# Patient Record
Sex: Male | Born: 1985 | Race: White | Hispanic: No | Marital: Single | Smoking: Never smoker
Health system: Southern US, Community
[De-identification: ages and names within clinical notes are randomized; demographics above are authoritative.]

## PROBLEM LIST (undated history)

## (undated) DIAGNOSIS — K219 Gastro-esophageal reflux disease without esophagitis: Secondary | ICD-10-CM

## (undated) DIAGNOSIS — F909 Attention-deficit hyperactivity disorder, unspecified type: Secondary | ICD-10-CM

## (undated) DIAGNOSIS — K8020 Calculus of gallbladder without cholecystitis without obstruction: Secondary | ICD-10-CM

## (undated) HISTORY — PX: FRACTURE SURGERY: SHX138

---
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
## 2004-12-09 ENCOUNTER — Ambulatory Visit: Payer: Self-pay | Admitting: Family Medicine

## 2004-12-10 ENCOUNTER — Encounter: Payer: Self-pay | Admitting: Family Medicine

## 2004-12-10 LAB — CONVERTED CEMR LAB: TSH: 2.36 microintl units/mL

## 2004-12-27 ENCOUNTER — Ambulatory Visit: Payer: Self-pay | Admitting: Cardiovascular Disease

## 2005-01-06 ENCOUNTER — Ambulatory Visit: Payer: Self-pay

## 2005-02-01 ENCOUNTER — Ambulatory Visit (HOSPITAL_COMMUNITY): Admission: RE | Admit: 2005-02-01 | Discharge: 2005-02-01 | Payer: Self-pay | Admitting: Emergency Medicine

## 2005-02-19 ENCOUNTER — Ambulatory Visit: Payer: Self-pay | Admitting: Cardiovascular Disease

## 2005-09-01 ENCOUNTER — Ambulatory Visit: Payer: Self-pay | Admitting: Family Medicine

## 2005-09-15 ENCOUNTER — Ambulatory Visit: Payer: Self-pay | Admitting: Family Medicine

## 2006-02-16 ENCOUNTER — Ambulatory Visit: Payer: Self-pay | Admitting: Family Medicine

## 2006-05-21 IMAGING — CT CT HEAD W/O CM
1 series · 16 of 30 positions shown, 20 images · non-contrast
Comparison: none

CLINICAL DATA: 19 year old male; headache and fever.
 HEAD CT WITHOUT CONTRAST ? 02/01/05:
TECHNIQUE: 5 mm collimated images were obtained from the base of the skull through the vertex according to standard protocol without contrast.
 No comparisons.

[Series 2: head_seq 4.5 h42s st · axial · 0.43mm/px · z∈[-144,-18]mm · 16 of 32 slices shown, 20 images]
[im 2/32  brain]
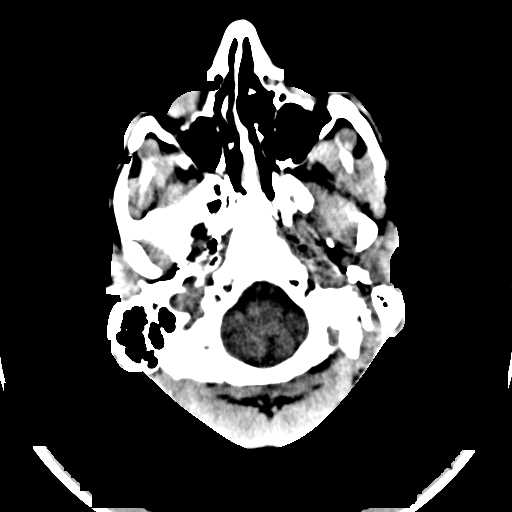
[im 2/32  bone]
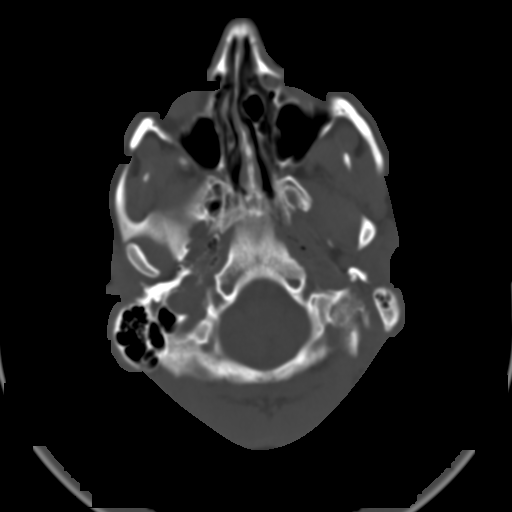
[im 4/32  brain]
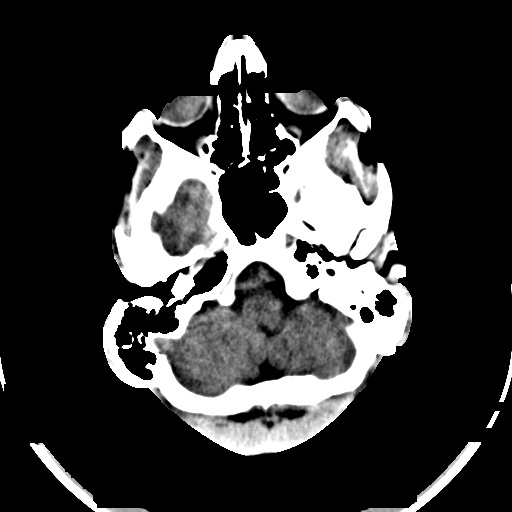
[im 6/32  brain]
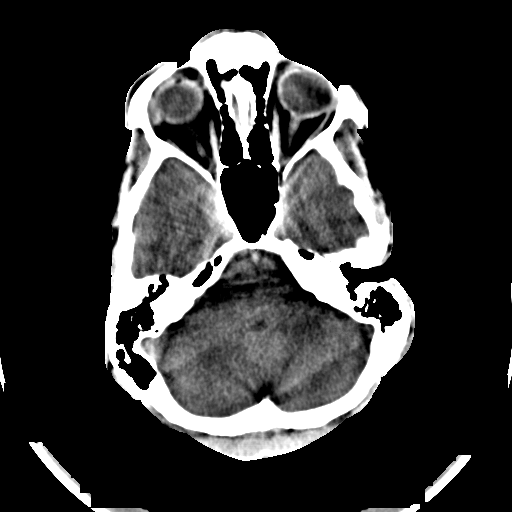
[im 8/32  brain]
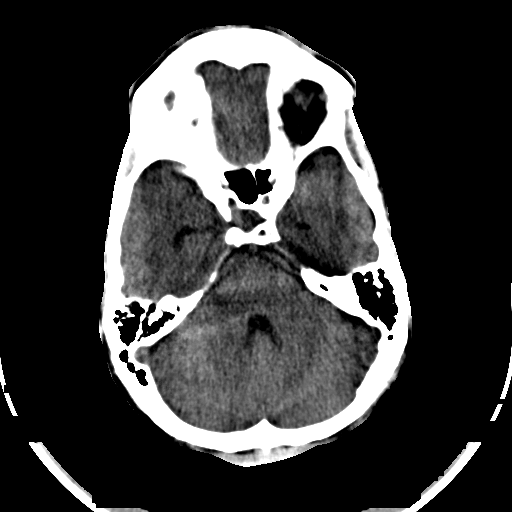
[im 9/32  brain]
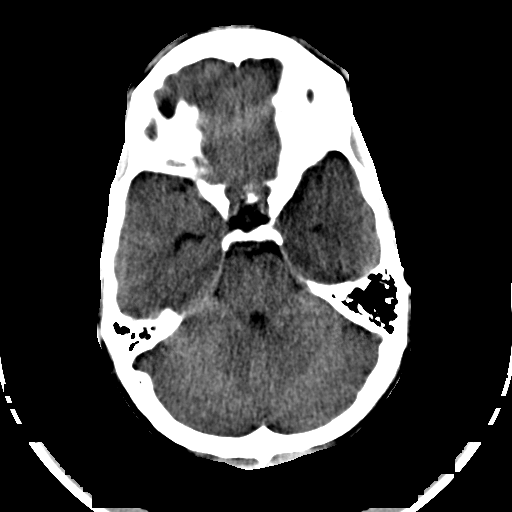
[im 9/32  bone]
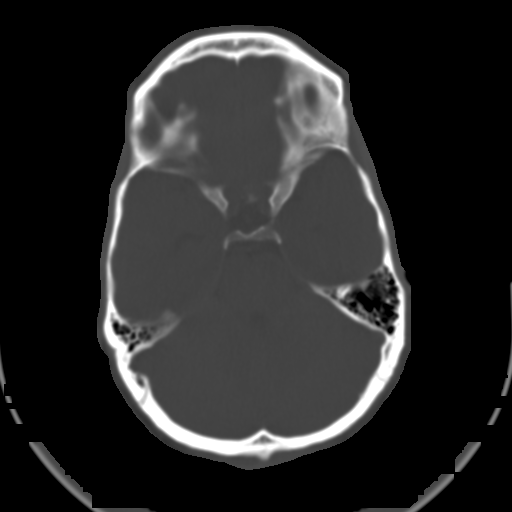
[im 11/32  brain]
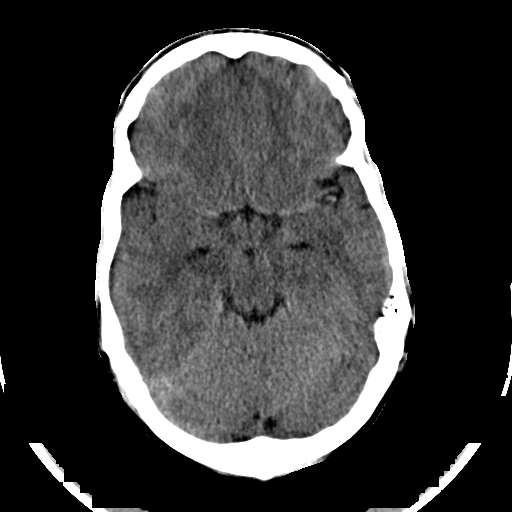
[im 13/32  brain]
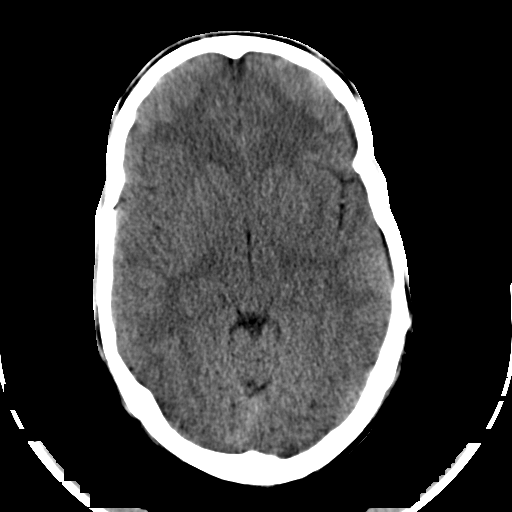
[im 15/32  brain]
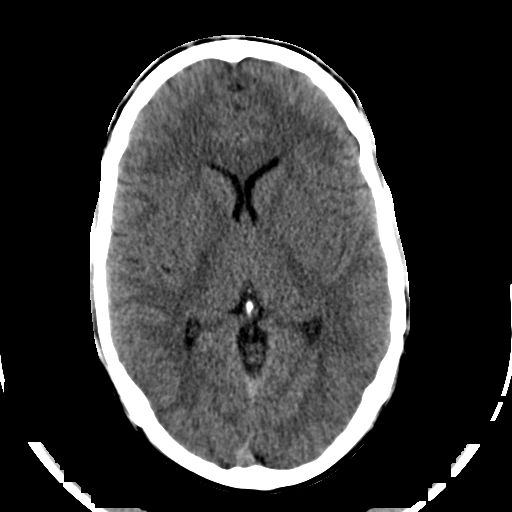
[im 17/32  brain]
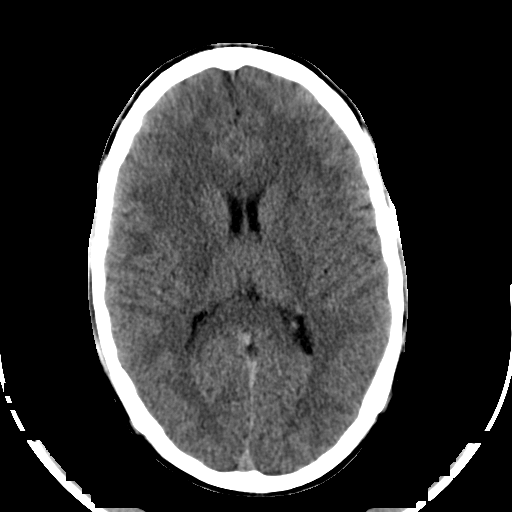
[im 17/32  bone]
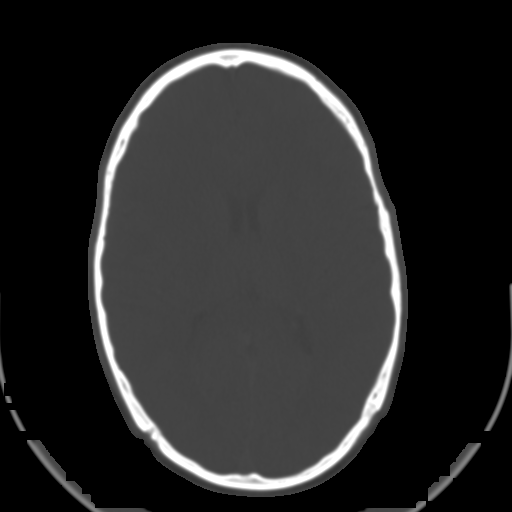
[im 19/32  brain]
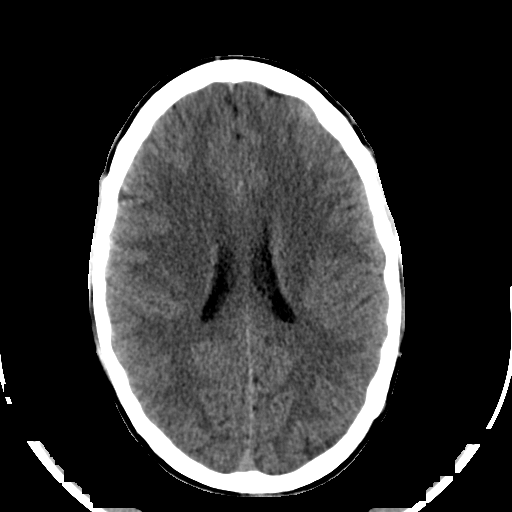
[im 21/32  brain]
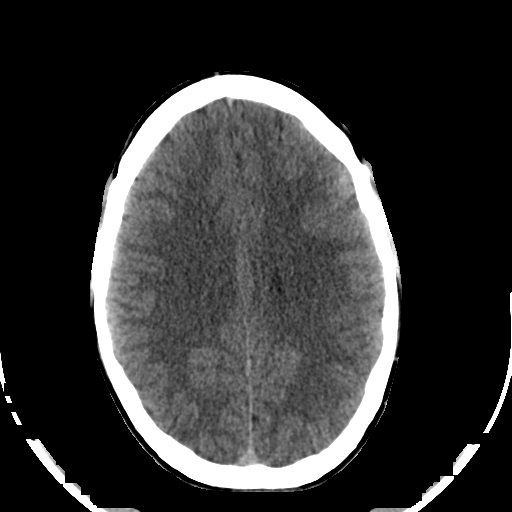
[im 23/32  brain]
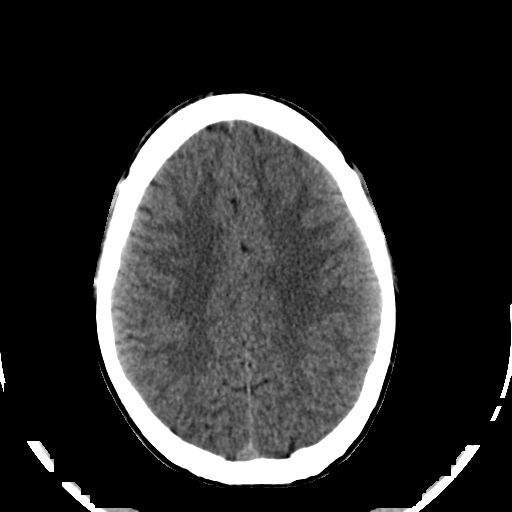
[im 24/32  brain]
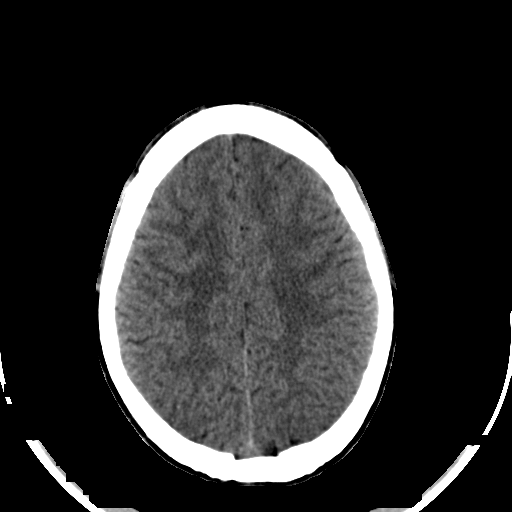
[im 24/32  bone]
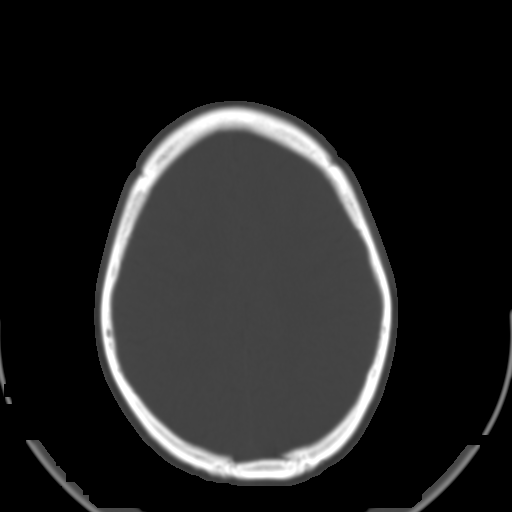
[im 26/32  brain]
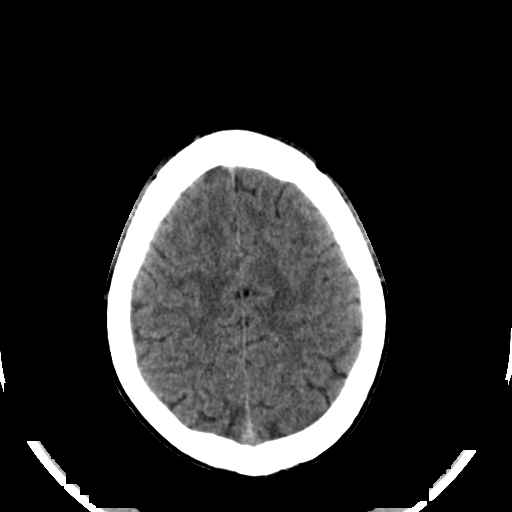
[im 28/32  brain]
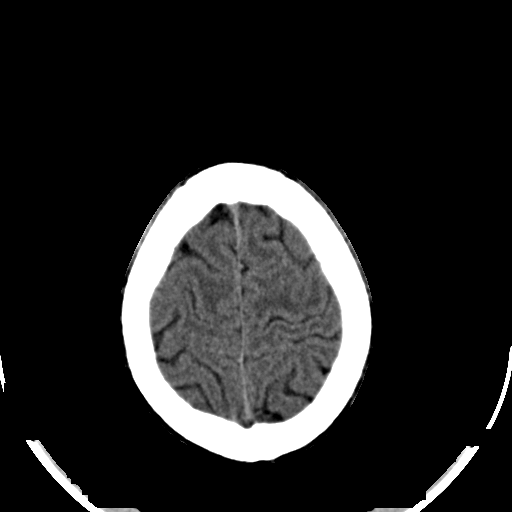
[im 30/32  brain]
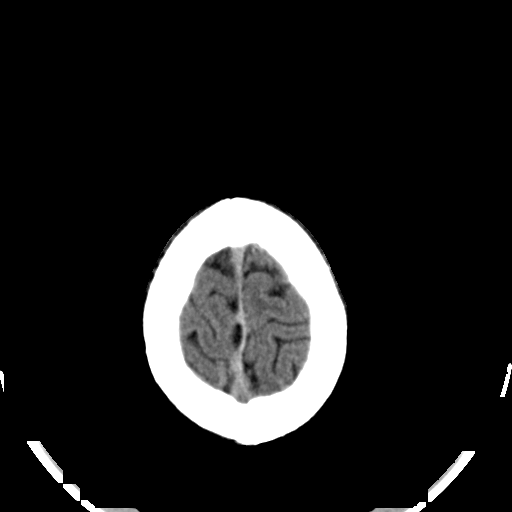

[16 of 30 positions shown; findings below may reference images not displayed]

FINDINGS: No acute mass effect, edema, hemorrhage, herniation, or hydrocephalus.  The gray-white matter differentiation is maintained.  No extra-axial fluid collection.  The ventricles are symmetric and midline.  The cisterns are patent.
 Visualized sinuses and mastoids are clear.
IMPRESSION: No acute intracranial finding.

## 2007-02-18 ENCOUNTER — Emergency Department (HOSPITAL_COMMUNITY): Admission: EM | Admit: 2007-02-18 | Discharge: 2007-02-18 | Payer: Self-pay | Admitting: Emergency Medicine

## 2008-01-03 ENCOUNTER — Ambulatory Visit: Payer: Self-pay | Admitting: Family Medicine

## 2008-01-12 ENCOUNTER — Ambulatory Visit: Payer: Self-pay | Admitting: Family Medicine

## 2008-01-13 ENCOUNTER — Encounter: Payer: Self-pay | Admitting: Family Medicine

## 2009-03-08 ENCOUNTER — Ambulatory Visit: Payer: Self-pay | Admitting: Family Medicine

## 2011-01-10 NOTE — Op Note (Signed)
NAMELARELL, BANEY                       ACCOUNT NO.:  [id number]   MEDICAL RECORD NO.:  [id number]                   PATIENT TYPE:  AMB   LOCATION:  [facility]                                  FACILITY:  [facility]   PHYSICIAN:  Lowell Bouton, M.D.      DATE OF BIRTH:  Aug 01, 1986   DATE OF PROCEDURE:  06/12/2003  DATE OF DISCHARGE:                                 OPERATIVE REPORT   PREOPERATIVE DIAGNOSIS:  Fracture proximal phalanx, left middle finger.   POSTOPERATIVE DIAGNOSIS:  Fracture proximal phalanx, left middle finger.   PROCEDURE:  Open reduction and internal fixation, left middle finger  proximal  phalanx.   SURGEON:  Lowell Bouton, M.D.   ANESTHESIA:  General.   FINDINGS:  The patient had an oblique displaced fracture of the proximal  phalanx of the left middle finger. There was a nondisplaced fracture more  proximal  to the distal  portion.   DESCRIPTION OF PROCEDURE:  Under general anesthesia with a tourniquet on the  left arm the left hand was prepped and draped in the usual sterile fashion.  After exsanguinating the limb the tourniquet was inflated to 250 mmHg. A  0.5% Marcaine digital block was inserted for pain control.   A longitudinal incision was made in the midline across the dorsum of the  proximal phalanx of the left middle finger. Sharp dissection was carried  through the subcutaneous tissues and bleeding points were coagulated. Sharp  dissection was carried down through the extensor tendon to the bone. The  periosteum was elevated with the Therapist, nutritional.  The fracture site was debrided with a curet and irrigated with saline.   The fracture was then reduced and held with an Monaco clamp. A 4-5 K-wire was  placed temporarily across the distal  part of the fracture to stabilize it  and prevent rotation. X-rays showed good alignment. A 2.0-mm screw was then  placed across the fracture site proximal  to the K-wire. This held the  fracture in good alignment. The Ikuda clamp was then removed and a 2nd screw  was placed across the proximal nondisplaced fracture using a 2.0-mm mini  fragment set screw. A 1.5-mm screw was then placed distal  to the first  screw after removing the K-wire to prevent rotation of the fragment. X-rays  showed anatomic alignment. Clinically there was no rotational deformity.   The wound was then irrigated with saline. The periosteum was then closed  with 4-0 Mersilene and the extensor tendon was repaired with 4-0 Mersilene.  The skin was closed with a 3-0 subcuticular Prolene. Steri-Strips were  applied followed  by sterile dressings and a dorsal protective splint.   The patient tolerated the procedure well. He went to the recovery room awake  in stable and good condition.  Lowell Bouton, M.D.    EMM/MEDQ  D:  06/12/2003  T:  06/12/2003  Job:  [id number]

## 2011-06-11 LAB — BASIC METABOLIC PANEL
CO2: 24
Chloride: 101
GFR calc non Af Amer: >60
Potassium: 3.4 — ABNORMAL LOW
Sodium: 138

## 2011-06-11 LAB — CBC
HCT: 45.2
Hemoglobin: 15.9
MCHC: 35.1
MCV: 86.1
RBC: 5.25
RDW: 12.5
WBC: 9.9

## 2011-06-11 LAB — DIFFERENTIAL
Basophils Absolute: 0
Basophils Relative: 0
Eosinophils Relative: 0

## 2013-07-22 ENCOUNTER — Other Ambulatory Visit: Payer: Self-pay | Admitting: Family Medicine

## 2013-07-22 ENCOUNTER — Ambulatory Visit (INDEPENDENT_AMBULATORY_CARE_PROVIDER_SITE_OTHER): Payer: BC Managed Care – PPO | Admitting: Family Medicine

## 2013-07-22 VITALS — BP 118/64 | HR 70 | Temp 99.0°F | Resp 16 | Ht 72.0 in | Wt 164.6 lb

## 2013-07-22 DIAGNOSIS — R002 Palpitations: Secondary | ICD-10-CM

## 2013-07-22 LAB — POCT CBC
Lymph, poc: 1.6 (ref 0.6–3.4)
MCHC: 32.1 g/dL (ref 31.8–35.4)
MCV: 93 fL (ref 80–97)
MID (cbc): 0.3 (ref 0–0.9)
POC LYMPH PERCENT: 32.9 %L (ref 10–50)
Platelet Count, POC: 210 10*3/uL (ref 142–424)
RDW, POC: 13.6 %
WBC: 4.9 10*3/uL (ref 4.6–10.2)

## 2013-07-22 NOTE — Patient Instructions (Signed)
Let me know if you continue to have problems with palpitations.  In that case we will send you to have a "holter monitor."  I will be in touch with your thyroid results.  If you are having chest pain, prolonged palpitations or otherwise are not doing ok please proceed to the ER

## 2013-07-22 NOTE — Progress Notes (Signed)
[facility] [address] [place] [address] [phone]  Date:  07/22/2013   Name:  Victor West   DOB:  31-Oct-1985   MRN:  [id number]  PCP:  Victor Givens, MD    Chief Complaint: Palpitations   History of Present Illness:  Victor West is a 27 y.o. very pleasant male patient who presents with the following:  He has noted heart palpitations for about one week.  He has noted this fairly constantly all week.  It does not seem to matter what he is doing; he will note the palp with activity and at rest.   He has felt a little dizzy.  Tried to exercise yesterday and he felt lightheaded.   He does feel a bit short winded.  "My chest kind of feels tight."  He does not have CP.    He is generally healthy.   He has never had any heart problems as far as he knows.    He may have been staying up later than usual and using more caffiene than usual.  He has tried to cut back on his caffiene over the last couple of day He is not taking any particualar supplements.   He has not been using any drugs. He did drink alcohol last week but not this week.    He does not smoke.  He is generally quite healthy   There are no active problems to display for this patient.   History reviewed. No pertinent past medical history.  History reviewed. No pertinent past surgical history.  History  Substance Use Topics  . Smoking status: Never Smoker   . Smokeless tobacco: Not on file  . Alcohol Use: 1.5 oz/week    3 drink(s) per week    History reviewed. No pertinent family history.  Allergies  Allergen Reactions  . Amoxicillin     REACTION: HIVES, THROAT SWELLING  . Cephalexin     REACTION: GI UPSET  . Erythromycin     REACTION: NAUSEA AND VOMITING    Medication list has been reviewed and updated.  No current outpatient prescriptions on file prior to visit.   No current facility-administered medications on file prior to visit.    Review of  Systems:  As per HPI- otherwise negative.   Physical Examination: Filed Vitals:   07/22/13 1419  BP: 118/64  Pulse: 70  Temp: 99 F (37.2 C)  Resp: 16   Filed Vitals:   07/22/13 1419  Height: 6' (1.829 m)  Weight: 164 lb 9.6 oz (74.662 kg)   Body mass index is 22.32 kg/(m^2). Ideal Body Weight: Weight in (lb) to have BMI = 25: 183.9  GEN: WDWN, NAD, Non-toxic, A & O x 3, looks well HEENT: Atraumatic, Normocephalic. Neck supple. No masses, No LAD. Ears and Nose: No external deformity. CV: RRR, No M/G/R. No JVD. No thrill. No extra heart sounds.  Long listen; no arrythmia noted PULM: CTA B, no wheezes, crackles, rhonchi. No retractions. No resp. distress. No accessory muscle use. ABD: S, NT, ND, +BS. No rebound. No HSM. EXTR: No c/c/e NEURO Normal gait.  PSYCH: Normally interactive. Conversant. Not depressed or anxious appearing.  Calm demeanor.   EKG:  NSR, no arrythmia noted.  Ran rhythm strip for a while and did not have any arrythmia. Had pt change position while on EKG- NSR continued  Results for orders placed in visit on 07/22/13  POCT CBC      Result Value Range  WBC 4.9  4.6 - 10.2 K/uL   Lymph, poc 1.6  0.6 - 3.4   POC LYMPH PERCENT 32.9  10 - 50 %L   MID (cbc) 0.3  0 - 0.9   POC MID % 6.8  0 - 12 %M   POC Granulocyte 3.0  2 - 6.9   Granulocyte percent 60.3  37 - 80 %G   RBC 5.32  4.69 - 6.13 M/uL   Hemoglobin 15.9  14.1 - 18.1 g/dL   HCT, POC 16.1  09.6 - 53.7 %   MCV 93.0  80 - 97 fL   MCH, POC 29.9  27 - 31.2 pg   MCHC 32.1  31.8 - 35.4 g/dL   RDW, POC 04.5     Platelet Count, POC 210  142 - 424 K/uL   MPV 10.2  0 - 99.8 fL  GLUCOSE, POCT (MANUAL RESULT ENTRY)      Result Value Range   POC Glucose 77  70 - 99 mg/dl    Assessment and Plan: Heart palpitations - Plan: EKG 12-Lead, POCT CBC, POCT glucose (manual entry), TSH  Victor West is here today because he has felt an irregular heart rhythm at home for the past week . Admits that this seems to be  getting better and he does not feel anything amiss at the moment.  Suspect he is having PVCs. Offered to have him seen at the ED for further evaluation, and discussed a holter monitor.  For now he does not wish to go to the ED, and would like to observe his sx over the next couple of days and then decide about a holter.  Will get back in touch with the rest of his labs.  Will plan further follow- up pending labs.   Signed Abbe Amsterdam, MD

## 2013-07-23 LAB — BASIC METABOLIC PANEL WITH GFR
BUN: 11 mg/dL (ref 6–23)
Calcium: 9.8 mg/dL (ref 8.4–10.5)
Chloride: 101 mEq/L (ref 96–112)
Creat: 1.01 mg/dL (ref 0.50–1.35)
GFR, Est African American: >89 mL/min
Glucose, Bld: 88 mg/dL (ref 70–99)
Sodium: 138 mEq/L (ref 135–145)

## 2013-07-24 ENCOUNTER — Encounter: Payer: Self-pay | Admitting: Family Medicine

## 2013-12-09 ENCOUNTER — Ambulatory Visit (INDEPENDENT_AMBULATORY_CARE_PROVIDER_SITE_OTHER): Payer: BC Managed Care – PPO | Admitting: Family Medicine

## 2013-12-09 VITALS — BP 100/64 | HR 70 | Temp 98.6°F | Resp 18 | Ht 71.5 in | Wt 157.0 lb

## 2013-12-09 DIAGNOSIS — J329 Chronic sinusitis, unspecified: Secondary | ICD-10-CM

## 2013-12-09 MED ORDER — LEVOFLOXACIN 500 MG PO TABS: 500.0000 mg | ORAL_TABLET | Freq: Every day | ORAL | Status: DC

## 2013-12-09 NOTE — Progress Notes (Signed)
Patient ID: Victor PleasureDaniel T West MRN: [id number], DOB: 05/27/1986, 28 y.o. Date of Encounter: 12/09/2013, 2:05 PM  Primary Physician: Crawford GivensGraham Duncan, MD  Chief Complaint:  Chief Complaint  Patient presents with  . Sinusitis    pressure behind eyes   . Generalized Body Aches  . Fatigue  . 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 He was traveling by air x3 days ago to go home and visit for the day at [place]. He states the day after, began with his illness of sinus congestion and pressure.   28 y.o. year old male presents with 2 days history of nasal congestion, post nasal drip, sore throat, sinus pressure, and cough. Afebrile. No chills. Nasal congestion thick and green/yellow. Sinus pressure is the worst symptom. Cough is productive secondary to post nasal drip and not associated with time of day. Ears feel full, leading to sensation of muffled hearing. Has tried OTC cold preps without success. No GI complaints.   No recent antibiotics, or sick contacts  Recent trip to [place]  No leg trauma, sedentary periods, h/o cancer, or tobacco use.  History reviewed. No pertinent past medical history.   Home Meds: Prior to Admission medications   Medication Sig Start Date End Date Taking? Authorizing Provider  Multiple Vitamin (MULTIVITAMIN) tablet Take 1 tablet by mouth daily. Pt is unsure of correct dosage but states it is an OTC medication   Yes Historical Provider, MD    Allergies:  Allergies  Allergen Reactions  . Amoxicillin     REACTION: HIVES, THROAT SWELLING  . Cephalexin     REACTION: GI UPSET  . Erythromycin     REACTION: NAUSEA AND VOMITING    History   Social History  . Marital Status: Single    Spouse Name: N/A    Number of Children: N/A  . Years of Education: N/A   Occupational History  . Not on file.   Social History Main Topics  . Smoking status: Never Smoker   . Smokeless tobacco: Not on file  .  Alcohol Use: 1.5 oz/week    3 drink(s) per week  . Drug Use: No  . Sexual Activity: Not on file   Other Topics Concern  . Not on file   Social History Narrative  . No narrative on file     Review of Systems: Constitutional: negative for chills, fever, night sweats or weight changes Cardiovascular: negative for chest pain or palpitations Respiratory: negative for hemoptysis, wheezing, or shortness of breath Abdominal: negative for abdominal pain, nausea, vomiting or diarrhea Dermatological: negative for rash Neurologic: negative for headache   Physical Exam: Blood pressure 100/64, pulse 70, temperature 98.6 F (37 C), temperature source Oral, resp. rate 18, height 5' 11.5" (1.816 m), weight 157 lb (71.215 kg), SpO2 99.00%., Body mass index is 21.59 kg/(m^2). General: Well developed, well nourished, in no acute distress. Head: Normocephalic, atraumatic, eyes without discharge, sclera non-icteric, nares are congested. Bilateral auditory canals clear, TM's are without perforation, pearly grey with reflective cone of light bilaterally. Serous effusion bilaterally behind TM's. Maxillary sinus TTP. Oral cavity moist, dentition normal. Posterior pharynx with post nasal drip and mild erythema. No peritonsillar abscess or tonsillar exudate. Neck: Supple. No thyromegaly. Full ROM. No lymphadenopathy. Lungs: Clear bilaterally to auscultation without wheezes, rales, or rhonchi. Breathing is unlabored.  Heart: RRR with S1 S2. No murmurs, rubs, or gallops appreciated. Msk:  Strength and tone normal for  age. Extremities: No clubbing or cyanosis. No edema. Neuro: Alert and oriented X 3. Moves all extremities spontaneously. CNII-XII grossly in tact. Psych:  Responds to questions appropriately with a normal affect.   Labs:   ASSESSMENT AND PLAN:  28 y.o. year old male with sinusitis -Sinusitis - Plan: levofloxacin (LEVAQUIN) 500 MG tablet    -Tylenol/Motrin prn -Rest/fluids -RTC  precautions -RTC 3-5 days if no improvement  Signed, Elvina SidleKurt Elira Colasanti, MD 12/09/2013 2:05 PM

## 2013-12-09 NOTE — Patient Instructions (Signed)

## 2019-08-26 DIAGNOSIS — U071 COVID-19: Secondary | ICD-10-CM

## 2019-08-26 HISTORY — DX: COVID-19: U07.1

## 2022-07-11 ENCOUNTER — Ambulatory Visit (INDEPENDENT_AMBULATORY_CARE_PROVIDER_SITE_OTHER): Payer: 59

## 2022-07-11 ENCOUNTER — Ambulatory Visit (INDEPENDENT_AMBULATORY_CARE_PROVIDER_SITE_OTHER): Payer: 59 | Admitting: Orthopaedic Surgery

## 2022-07-11 DIAGNOSIS — G8929 Other chronic pain: Secondary | ICD-10-CM

## 2022-07-11 DIAGNOSIS — M25512 Pain in left shoulder: Secondary | ICD-10-CM

## 2022-07-11 NOTE — Progress Notes (Signed)
Office Visit Note   Patient: Victor West           Date of Birth: 02/22/1986           MRN: [id number] Visit Date: 07/11/2022              Requested by: Joaquim Nam, MD [address] [place],  [place] [address] PCP: Joaquim Nam, MD   Assessment & Plan: Visit Diagnoses:  1. 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  I will go ahead and order an MR arthrogram to look for Bankart lesion.  Follow-up after the MRI.  Follow-Up Instructions: No follow-ups on file.   Orders:  Orders Placed This Encounter  Procedures   XR Shoulder Left   No orders of the defined types were placed in this encounter.     Procedures: No procedures performed   Clinical Data: No additional findings.   Subjective: Chief Complaint  Patient presents with   Left Shoulder - Pain    HPI [name] is a very pleasant 36 year old gentleman here for evaluation of left shoulder instability.  He is right-hand dominant.  Had a traumatic dislocation in 2021 and then recently had another dislocation earlier this year about 5 months ago.  Since then he has felt weakness and decreased range of motion and continued apprehension to the shoulder.  He works as an Airline pilot.  Review of Systems  Constitutional: Negative.   All other systems reviewed and are negative.    Objective: Vital Signs: There were no vitals taken for this visit.  Physical Exam Vitals and nursing note reviewed.  Constitutional:      Appearance: He is well-developed.  HENT:     Head: Normocephalic and atraumatic.  Eyes:     Pupils: Pupils are equal, round, and reactive to light.  Pulmonary:     Effort: Pulmonary effort is normal.  Abdominal:     Palpations: Abdomen is soft.  Musculoskeletal:        General: Normal range of motion.     Cervical back: Neck supple.  Skin:    General: Skin is warm.  Neurological:      Mental Status: He is alert and oriented to person, place, and time.  Psychiatric:        Behavior: Behavior normal.        Thought Content: Thought content normal.        Judgment: Judgment normal.     Ortho Exam Examination of left shoulder shows normal active and passive range of motion.  Negative sulcus sign.  Positive apprehension sign.  Rotator cuff intact to manual muscle testing. Specialty Comments:  No specialty comments available.  Imaging: XR Shoulder Left  Result Date: 07/11/2022 Three-view x-rays of the left shoulder show no acute or structural abnormalities.    PMFS History: There are no problems to display for this patient.  No past medical history on file.  No family history on file.  No past surgical history on file. Social History   Occupational History   Not on file  Tobacco Use   Smoking status: Never   Smokeless tobacco: Not on file  Substance and Sexual Activity   Alcohol use: Yes    Alcohol/week: 3.0 standard drinks of alcohol    Types: 3 drink(s) per week   Drug use: No   Sexual activity: Not on file

## 2022-07-11 NOTE — Addendum Note (Signed)
Addended by: Wendi Maya on: 07/11/2022 02:09 PM   Modules accepted: Orders

## 2022-08-04 ENCOUNTER — Inpatient Hospital Stay: Admission: RE | Admit: 2022-08-04 | Payer: 59 | Source: Ambulatory Visit

## 2022-08-04 ENCOUNTER — Other Ambulatory Visit: Payer: 59

## 2022-08-06 ENCOUNTER — Ambulatory Visit: Payer: 59 | Admitting: Orthopaedic Surgery

## 2022-08-17 ENCOUNTER — Emergency Department (HOSPITAL_BASED_OUTPATIENT_CLINIC_OR_DEPARTMENT_OTHER): Payer: 59

## 2022-08-17 ENCOUNTER — Encounter (HOSPITAL_BASED_OUTPATIENT_CLINIC_OR_DEPARTMENT_OTHER): Payer: Self-pay | Admitting: Emergency Medicine

## 2022-08-17 ENCOUNTER — Emergency Department (HOSPITAL_BASED_OUTPATIENT_CLINIC_OR_DEPARTMENT_OTHER)
Admission: EM | Admit: 2022-08-17 | Discharge: 2022-08-17 | Disposition: A | Discharge status: Home / Self Care | Payer: 59 | Attending: Emergency Medicine | Admitting: Emergency Medicine

## 2022-08-17 ENCOUNTER — Other Ambulatory Visit: Payer: Self-pay

## 2022-08-17 DIAGNOSIS — R7309 Other abnormal glucose: Secondary | ICD-10-CM | POA: Diagnosis not present

## 2022-08-17 DIAGNOSIS — R079 Chest pain, unspecified: Secondary | ICD-10-CM | POA: Diagnosis not present

## 2022-08-17 DIAGNOSIS — D72829 Elevated white blood cell count, unspecified: Secondary | ICD-10-CM | POA: Diagnosis not present

## 2022-08-17 DIAGNOSIS — K8051 Calculus of bile duct without cholangitis or cholecystitis with obstruction: Secondary | ICD-10-CM | POA: Diagnosis not present

## 2022-08-17 DIAGNOSIS — R0602 Shortness of breath: Secondary | ICD-10-CM | POA: Diagnosis not present

## 2022-08-17 DIAGNOSIS — K8050 Calculus of bile duct without cholangitis or cholecystitis without obstruction: Secondary | ICD-10-CM

## 2022-08-17 DIAGNOSIS — R109 Unspecified abdominal pain: Secondary | ICD-10-CM | POA: Diagnosis present

## 2022-08-17 LAB — COMPREHENSIVE METABOLIC PANEL
ALT: 37 U/L (ref 0–44)
AST: 20 U/L (ref 15–41)
Albumin: 4.8 g/dL (ref 3.5–5.0)
Alkaline Phosphatase: 55 U/L (ref 38–126)
Anion gap: 10 (ref 5–15)
BUN: 16 mg/dL (ref 6–20)
CO2: 28 mmol/L (ref 22–32)
Calcium: 10 mg/dL (ref 8.9–10.3)
Chloride: 99 mmol/L (ref 98–111)
Creatinine, Ser: 0.99 mg/dL (ref 0.61–1.24)
GFR, Estimated: >60 mL/min (ref >60)
Glucose, Bld: 127 mg/dL — ABNORMAL HIGH (ref 70–99)
Potassium: 3.8 mmol/L (ref 3.5–5.1)
Sodium: 137 mmol/L (ref 135–145)
Total Bilirubin: 0.5 mg/dL (ref 0.3–1.2)
Total Protein: 7.8 g/dL (ref 6.5–8.1)

## 2022-08-17 LAB — URINALYSIS, ROUTINE W REFLEX MICROSCOPIC
Bilirubin Urine: NEGATIVE
Glucose, UA: NEGATIVE mg/dL
Hgb urine dipstick: NEGATIVE
Leukocytes,Ua: NEGATIVE
Nitrite: NEGATIVE
Protein, ur: NEGATIVE mg/dL
Specific Gravity, Urine: 1.038 — ABNORMAL HIGH (ref 1.005–1.030)
pH: 7.5 (ref 5.0–8.0)

## 2022-08-17 LAB — CBC
HCT: 43.5 % (ref 39.0–52.0)
Hemoglobin: 15.2 g/dL (ref 13.0–17.0)
MCH: 29.1 pg (ref 26.0–34.0)
MCHC: 34.9 g/dL (ref 30.0–36.0)
MCV: 83.3 fL (ref 80.0–100.0)
Platelets: 245 10*3/uL (ref 150–400)
RBC: 5.22 MIL/uL (ref 4.22–5.81)
RDW: 12.3 % (ref 11.5–15.5)
WBC: 11.4 10*3/uL — ABNORMAL HIGH (ref 4.0–10.5)
nRBC: 0 % (ref 0.0–0.2)

## 2022-08-17 LAB — LIPASE, BLOOD: Lipase: 27 U/L (ref 11–51)

## 2022-08-17 MED ORDER — IOHEXOL 300 MG/ML  SOLN
100.0000 mL | Freq: Once | INTRAMUSCULAR | Status: AC | PRN
  Administered 2022-08-17: 100 mL via INTRAVENOUS

## 2022-08-17 MED ORDER — FAMOTIDINE IN NACL 20-0.9 MG/50ML-% IV SOLN
20.0000 mg | Freq: Once | INTRAVENOUS | Status: AC
  Administered 2022-08-17: 20 mg via INTRAVENOUS
  Filled 2022-08-17: qty 50

## 2022-08-17 MED ORDER — MORPHINE SULFATE (PF) 4 MG/ML IV SOLN
4.0000 mg | Freq: Once | INTRAVENOUS | Status: AC
  Administered 2022-08-17: 4 mg via INTRAVENOUS
  Filled 2022-08-17: qty 1

## 2022-08-17 MED ORDER — ONDANSETRON HCL 4 MG/2ML IJ SOLN
4.0000 mg | Freq: Once | INTRAMUSCULAR | Status: AC
  Administered 2022-08-17: 4 mg via INTRAVENOUS
  Filled 2022-08-17: qty 2

## 2022-08-17 MED ORDER — HYDROMORPHONE HCL 1 MG/ML IJ SOLN
1.0000 mg | Freq: Once | INTRAMUSCULAR | Status: AC
  Administered 2022-08-17: 1 mg via INTRAVENOUS
  Filled 2022-08-17: qty 1

## 2022-08-17 MED ORDER — ONDANSETRON HCL 4 MG PO TABS: 4.0000 mg | ORAL_TABLET | Freq: Three times a day (TID) | ORAL | 0 refills | Status: AC | PRN

## 2022-08-17 MED ORDER — OXYCODONE-ACETAMINOPHEN 5-325 MG PO TABS: 1.0000 | ORAL_TABLET | Freq: Four times a day (QID) | ORAL | 0 refills | Status: AC | PRN

## 2022-08-17 MED ORDER — KETOROLAC TROMETHAMINE 30 MG/ML IJ SOLN
30.0000 mg | Freq: Once | INTRAMUSCULAR | Status: AC
  Administered 2022-08-17: 30 mg via INTRAVENOUS
  Filled 2022-08-17: qty 1

## 2022-08-17 MED ORDER — SODIUM CHLORIDE 0.9 % IV BOLUS
1000.0000 mL | Freq: Once | INTRAVENOUS | Status: AC
  Administered 2022-08-17: 1000 mL via INTRAVENOUS

## 2022-08-17 NOTE — ED Triage Notes (Signed)
Pt arrived POV, caox4, ambulatory. Pt c/o epigastric abd pain radiating between the shoulder blades along with N/V that has been intermittent for the last few weeks. Pt reports s/s seem to worsen after eating. Pt denies fever, diarrhea. Pain 7/10 at present.

## 2022-08-17 NOTE — Discharge Instructions (Signed)
You are seen in the emergency department for upper abdominal lower chest pain.  You had a workup including lab work CAT scan and ultrasound EKG.  The likely cause of your symptoms is gallstones in your gallbladder.  This will need follow-up with general surgery.  Included is contact information for the surgical clinic.  We are prescribing a short course of pain medication and nausea medication.  Please stick with a low-fat diet.  Return to the emergency department if any fevers or pain that lasts longer than 2 hours.

## 2022-08-17 NOTE — ED Provider Notes (Signed)
[facility] EMERGENCY DEPT Provider Note   CSN: [id number] Arrival date & time: 08/17/22  9892     History  Chief Complaint  Patient presents with   Abdominal Pain    Victor West is a 36 y.o. male.  He has no significant past medical history.  He is complaining of acute onset of diffuse abdominal pain starting around 12 hours ago radiates into back.  Severe.  Associated with nausea and 1 episode of nonbloody vomitus.  No diarrhea constipation or urinary symptoms.  No fevers or chills.  Pain also rates into chest and hurts to take a deep breath.  No prior surgical history.  Says he drinks alcohol weekly no tobacco no NSAID use  The history is provided by the patient.  Abdominal Pain Pain location:  Generalized Pain radiates to:  Back and chest Pain severity:  Severe Onset quality:  Sudden Duration:  12 hours Timing:  Constant Progression:  Unchanged Chronicity:  New Context: not trauma   Relieved by:  Nothing Worsened by:  Nothing Ineffective treatments:  None tried Associated symptoms: chest pain, nausea, shortness of breath and vomiting   Associated symptoms: no constipation, no cough, no diarrhea, no dysuria, no fever, no hematemesis and no sore throat        Home Medications Prior to Admission medications   Medication Sig Start Date End Date Taking? Authorizing Provider  levofloxacin (LEVAQUIN) 500 MG tablet Take 1 tablet (500 mg total) by mouth daily. 12/09/13   Elvina Sidle, MD  Multiple Vitamin (MULTIVITAMIN) tablet Take 1 tablet by mouth daily. Pt is unsure of correct dosage but states it is an OTC medication    [provider]      Allergies    Amoxicillin, Cephalexin, and Erythromycin    Review of Systems   Review of Systems  Constitutional:  Negative for fever.  HENT:  Negative for sore throat.   Eyes:  Negative for visual disturbance.  Respiratory:  Positive for shortness of breath. Negative for cough.   Cardiovascular:   Positive for chest pain.  Gastrointestinal:  Positive for abdominal pain, nausea and vomiting. Negative for constipation, diarrhea and hematemesis.  Genitourinary:  Negative for dysuria.  Musculoskeletal:  Positive for back pain.    Physical Exam Updated Vital Signs BP (!) 151/97 (BP Location: Left Arm)   Pulse (!) 50   Temp 98.3 F (36.8 C) (Oral)   Resp 20   Ht 5\' 11"  (1.803 m)   Wt 81.6 kg   SpO2 98%   BMI 25.10 kg/m  Physical Exam Vitals and nursing note reviewed.  Constitutional:      General: He is not in acute distress.    Appearance: He is well-developed.  HENT:     Head: Normocephalic and atraumatic.  Eyes:     Conjunctiva/sclera: Conjunctivae normal.  Cardiovascular:     Rate and Rhythm: Regular rhythm. Bradycardia present.     Heart sounds: No murmur heard. Pulmonary:     Effort: Pulmonary effort is normal. No respiratory distress.     Breath sounds: Normal breath sounds.  Abdominal:     Palpations: Abdomen is soft.     Tenderness: There is generalized abdominal tenderness. There is no guarding or rebound.  Musculoskeletal:        General: No swelling.     Cervical back: Neck supple.  Skin:    General: Skin is warm and dry.     Capillary Refill: Capillary refill takes less than 2 seconds.  Neurological:     General: No focal deficit present.     Mental Status: He is alert.     ED Results / Procedures / Treatments   Labs (all labs ordered are listed, but only abnormal results are displayed) Labs Reviewed  COMPREHENSIVE METABOLIC PANEL - Abnormal; Notable for the following components:      Result Value   Glucose, Bld 127 (*)    All other components within normal limits  CBC - Abnormal; Notable for the following components:   WBC 11.4 (*)    All other components within normal limits  URINALYSIS, ROUTINE W REFLEX MICROSCOPIC - Abnormal; Notable for the following components:   Color, Urine COLORLESS (*)    Specific Gravity, Urine 1.038 (*)     Ketones, ur TRACE (*)    All other components within normal limits  LIPASE, BLOOD    EKG EKG Interpretation  Date/Time:  Sunday August 17 2022 09:26:33 EST Ventricular Rate:  54 PR Interval:  148 QRS Duration: 93 QT Interval:  446 QTC Calculation: 423 R Axis:   87 Text Interpretation: Sinus rhythm No old tracing to compare Confirmed by Meridee Score [phone]) on 08/17/2022 9:43:16 AM  Radiology US Abdomen Limited RUQ (LIVER/GB)  Result Date: 08/17/2022 CLINICAL DATA:  Right upper quadrant abdominal pain EXAM: ULTRASOUND ABDOMEN LIMITED RIGHT UPPER QUADRANT COMPARISON:  Same day CT FINDINGS: Gallbladder: The gallbladder is nondilated. Mild wall thickening of the fundus adjacent to the liver. Small nonshadowing echogenic focus in the gallbladder neck, favored to be a small nonshadowing stone. Negative sonographic Murphy sign. Common bile duct: Diameter: 3 mm, normal.  No intrahepatic ductal dilation. Liver: No focal lesion identified. Within normal limits in parenchymal echogenicity. Portal vein is patent on color Doppler imaging with normal direction of blood flow towards the liver. Other: None. IMPRESSION: Probable 5 mm gallstone in the region of the gallbladder neck. Mild focal gallbladder wall thickening in the fundus. Gallbladder is nondilated and there is a reportedly negative sonographic Murphy sign. Cholecystitis is unlikely. Electronically Signed   By: Caprice Renshaw M.D.   On: 08/17/2022 12:41   CT Abdomen Pelvis W Contrast  Result Date: 08/17/2022 CLINICAL DATA:  Abdominal pain EXAM: CT ABDOMEN AND PELVIS WITH CONTRAST TECHNIQUE: Multidetector CT imaging of the abdomen and pelvis was performed using the standard protocol following bolus administration of intravenous contrast. RADIATION DOSE REDUCTION: This exam was performed according to the departmental dose-optimization program which includes automated exposure control, adjustment of the mA and/or kV according to patient size  and/or use of iterative reconstruction technique. CONTRAST:  OMNIPAQUE IOHEXOL 300 MG/ML  SOLN COMPARISON:  None Available. FINDINGS: Lower chest: No acute abnormality. Hepatobiliary: No suspicious liver lesion. Gallstones identified. No gallbladder wall thickening or pericholecystic inflammation. No bile duct dilatation. Pancreas: Unremarkable. No pancreatic ductal dilatation or surrounding inflammatory changes. Spleen: Normal in size without focal abnormality. Adrenals/Urinary Tract: Normal adrenal glands. No kidney mass, nephrolithiasis, or hydronephrosis. Urinary bladder is unremarkable. Stomach/Bowel: Stomach appears normal. The appendix is visualized and appears normal. No pathologic dilatation to suggest bowel obstruction. No small bowel wall thickening or inflammation. Decompressed descending colon with equivocal wall thickening. No surrounding inflammatory fat stranding or free fluid. Vascular/Lymphatic: No significant vascular findings are present. No enlarged abdominal or pelvic lymph nodes. Reproductive: Prostate is unremarkable. Other: No abdominal wall hernia or abnormality. No abdominopelvic ascites. No signs of pneumoperitoneum. Musculoskeletal: No acute or significant osseous findings. IMPRESSION: 1. No definite acute findings within the abdomen and pelvis. 2.  Gallstones 3. Decompressed descending colon with equivocal wall thickening. No surrounding inflammatory fat stranding or free fluid. Correlate for any clinical signs or symptoms of colitis. Electronically Signed   By: Signa Kell M.D.   On: 08/17/2022 10:42    Procedures Procedures    Medications Ordered in ED Medications  sodium chloride 0.9 % bolus 1,000 mL (0 mLs Intravenous Stopped 08/17/22 1223)  ondansetron (ZOFRAN) injection 4 mg (4 mg Intravenous Given 08/17/22 0950)  morphine (PF) 4 MG/ML injection 4 mg (4 mg Intravenous Given 08/17/22 0950)  famotidine (PEPCID) IVPB 20 mg premix (0 mg Intravenous Stopped 08/17/22  1046)  iohexol (OMNIPAQUE) 300 MG/ML solution 100 mL (100 mLs Intravenous Contrast Given 08/17/22 1016)  HYDROmorphone (DILAUDID) injection 1 mg (1 mg Intravenous Given 08/17/22 1051)  ketorolac (TORADOL) 30 MG/ML injection 30 mg (30 mg Intravenous Given 08/17/22 1052)    ED Course/ Medical Decision Making/ A&P                           Medical Decision Making Amount and/or Complexity of Data Reviewed Labs: ordered. Radiology: ordered.  Risk Prescription drug management.  This patient complains of low chest upper abdominal pain; this involves an extensive number of treatment Options and is a complaint that carries with it a high risk of complications and morbidity. The differential includes biliary colic, peptic ulcer disease, perforation, pneumonia, pleurisy, PE, ACS  I ordered, reviewed and interpreted labs, which included CBC with mildly elevated white count, chemistries normal other than mildly elevated glucose LFTs normal, urinalysis without signs of infection I ordered medication IV pain medication nausea medication and reviewed PMP when indicated. I ordered imaging studies which included CT abdomen and pelvis ultrasound right upper quadrant and I independently    visualized and interpreted imaging which showed gallstone in the neck, nonspecific decompressed colon Additional history obtained from patient significant other Previous records obtained and reviewed in epic no recent admissions Cardiac monitoring reviewed, sinus bradycardia Social determinants considered, no significant barriers Critical Interventions: None  After the interventions stated above, I reevaluated the patient and found patient's pain to be improved Admission and further testing considered, symptoms seem most consistent with biliary colic.  Does not need emergent surgery at this time.  Patient's pain is adequately controlled.  Will cover with pain medication and recommended close follow-up with general  surgery.  Clear return instructions discussed.          Final Clinical Impression(s) / ED Diagnoses Final diagnoses:  Biliary colic    Rx / DC Orders ED Discharge Orders          Ordered    oxyCODONE-acetaminophen (PERCOCET/ROXICET) 5-325 MG tablet  Every 6 hours PRN        08/17/22 1342    ondansetron (ZOFRAN) 4 MG tablet  Every 8 hours PRN        08/17/22 1342              Terrilee Files, MD 08/17/22 1811

## 2022-09-02 ENCOUNTER — Other Ambulatory Visit: Payer: Self-pay | Admitting: Surgery

## 2022-09-09 ENCOUNTER — Encounter (HOSPITAL_COMMUNITY): Payer: Self-pay | Admitting: Surgery

## 2022-09-09 ENCOUNTER — Other Ambulatory Visit: Payer: Self-pay

## 2022-09-10 NOTE — H&P (Signed)
REFERRING PHYSICIAN: Self  PROVIDER: Beverlee Nims, MD  MRN: [id number] DOB: 30-Jun-1986 DATE OF ENCOUNTER: 09/02/2022 Subjective   Chief Complaint: Abdominal Pain   History of Present Illness: Victor West is a 37 y.o. male who is seen today as an office consultation for evaluation of Abdominal Pain .   This is a 37 year old gentleman who presents with right upper quadrant abdominal pain and gallstones. He actually had his first attack of gallstones with right upper quadrant abdominal pain, bloating, and nausea around Thanksgiving. On Christmas eve he had other attack prompting a visit to the emergency room. He was found on ultrasound that time to have gallstones with a 5 mm gallstone in the neck of gallbladder. His liver function test were normal. A CAT scan of the abdomen and pelvis was otherwise unremarkable. His white blood count was slightly elevated. Since that time, he has been on a low-fat diet but still does not feel well. He has had some loose bowel movements. He has had no fevers or chills. He denies jaundice.  Review of Systems: A complete review of systems was obtained from the patient. I have reviewed this information and discussed as appropriate with the patient. See HPI as well for other ROS.  ROS   Medical History: History reviewed. No pertinent past medical history.  There is no problem list on file for this patient.  History reviewed. No pertinent surgical history.   Allergies  Allergen Reactions  Amoxicillin Anaphylaxis  REACTION: HIVES, THROAT SWELLING  Cephalexin Other (See Comments)  REACTION: GI UPSET  Erythromycin Other (See Comments)  REACTION: NAUSEA AND VOMITING   No current outpatient medications on file prior to visit.   No current facility-administered medications on file prior to visit.   Family History  Problem Relation Age of Onset  Leukemia Mother  High blood pressure (Hypertension) Father  Coronary Artery Disease (Blocked  arteries around heart) Father  Diabetes Father    Social History   Tobacco Use  Smoking Status Never  Smokeless Tobacco Never    Social History   Socioeconomic History  Marital status: Single  Tobacco Use  Smoking status: Never  Smokeless tobacco: Never  Substance and Sexual Activity  Alcohol use: Yes  Drug use: Never   Objective:   Vitals:  09/02/22 1156  BP: 100/70  Pulse: 76  Temp: 36.5 C (97.7 F)  SpO2: 98%  Weight: 79.2 kg (174 lb 9.6 oz)  Height: 181 cm (5' 11.25")   Body mass index is 24.18 kg/m.  Physical Exam   He appears slightly uncomfortable on exam today  There is very mild tenderness with minimal guarding the right upper quadrant and no abdominal masses.  There is no hepatomegaly  Labs, Imaging and Diagnostic Testing: I reviewed his ultrasound, CT scan and laboratory data  Assessment and Plan:   Diagnoses and all orders for this visit:  Symptomatic cholelithiasis    At this point I had a long discussion with the patient regarding gallstones and the reasons to remove gallbladders. I gave him literature regarding this. Given his symptoms and the stone in the neck of the gallbladder, I recommend urgent laparoscopic cholecystectomy. I discussed the reasonings for this with him in detail. We discussed the surgical procedure in detail. We discussed the risks which includes but is not limited to bleeding, infection, injury to surrounding structures, bile duct injury, bile leak, the need to convert to an open procedure, cardiopulmonary issues, postoperative recovery, etc. He understands and wishes to proceed with  surgery soon as possible. I am our acute care surgeon next week in the hospital and we will try to get it on at that point unless he acutely worsens and needs surgery sooner. He agrees with the plans.

## 2022-09-10 NOTE — Anesthesia Preprocedure Evaluation (Addendum)
Anesthesia Evaluation  Patient identified by MRN, date of birth, ID band Patient awake    Reviewed: Allergy & Precautions, NPO status , Patient's Chart, lab work & pertinent test results  History of Anesthesia Complications Negative for: history of anesthetic complications  Airway Mallampati: II  TM Distance: >3 FB Neck ROM: Full    Dental no notable dental hx. (+) Dental Advisory Given   Pulmonary neg pulmonary ROS   Pulmonary exam normal        Cardiovascular negative cardio ROS Normal cardiovascular exam     Neuro/Psych negative neurological ROS     GI/Hepatic Neg liver ROS,GERD  ,,  Endo/Other  negative endocrine ROS    Renal/GU negative Renal ROS     Musculoskeletal negative musculoskeletal ROS (+)    Abdominal   Peds  Hematology negative hematology ROS (+)   Anesthesia Other Findings   Reproductive/Obstetrics                             Anesthesia Physical Anesthesia Plan  ASA: 2  Anesthesia Plan: General   Post-op Pain Management: Tylenol PO (pre-op)* and Toradol IV (intra-op)*   Induction: Intravenous  PONV Risk Score and Plan: 3 and Ondansetron, Dexamethasone, Midazolam and Scopolamine patch - Pre-op  Airway Management Planned: Oral ETT  Additional Equipment:   Intra-op Plan:   Post-operative Plan: Extubation in OR  Informed Consent: I have reviewed the patients History and Physical, chart, labs and discussed the procedure including the risks, benefits and alternatives for the proposed anesthesia with the patient or authorized representative who has indicated his/her understanding and acceptance.     Dental advisory given  Plan Discussed with: Anesthesiologist, CRNA and Surgeon  Anesthesia Plan Comments:        Anesthesia Quick Evaluation

## 2022-09-11 ENCOUNTER — Ambulatory Visit (HOSPITAL_COMMUNITY)
Admission: RE | Admit: 2022-09-11 | Discharge: 2022-09-11 | Disposition: A | Discharge status: Home / Self Care | Payer: 59 | Attending: Surgery | Admitting: Surgery

## 2022-09-11 ENCOUNTER — Ambulatory Visit (HOSPITAL_COMMUNITY): Payer: 59 | Admitting: Anesthesiology

## 2022-09-11 ENCOUNTER — Ambulatory Visit (HOSPITAL_BASED_OUTPATIENT_CLINIC_OR_DEPARTMENT_OTHER): Payer: 59 | Admitting: Anesthesiology

## 2022-09-11 ENCOUNTER — Encounter (HOSPITAL_COMMUNITY): Payer: Self-pay | Admitting: Surgery

## 2022-09-11 ENCOUNTER — Encounter (HOSPITAL_COMMUNITY)
Admission: RE | Disposition: A | Discharge status: Home / Self Care | Payer: Self-pay | Source: Home / Self Care | Attending: Surgery

## 2022-09-11 DIAGNOSIS — K811 Chronic cholecystitis: Secondary | ICD-10-CM | POA: Diagnosis not present

## 2022-09-11 DIAGNOSIS — K219 Gastro-esophageal reflux disease without esophagitis: Secondary | ICD-10-CM | POA: Diagnosis not present

## 2022-09-11 DIAGNOSIS — K8010 Calculus of gallbladder with chronic cholecystitis without obstruction: Secondary | ICD-10-CM

## 2022-09-11 HISTORY — PX: CHOLECYSTECTOMY: SHX55

## 2022-09-11 HISTORY — DX: Gastro-esophageal reflux disease without esophagitis: K21.9

## 2022-09-11 HISTORY — DX: Calculus of gallbladder without cholecystitis without obstruction: K80.20

## 2022-09-11 HISTORY — DX: Attention-deficit hyperactivity disorder, unspecified type: F90.9

## 2022-09-11 SURGERY — LAPAROSCOPIC CHOLECYSTECTOMY
Anesthesia: General | Site: Abdomen

## 2022-09-11 MED ORDER — KETOROLAC TROMETHAMINE 30 MG/ML IJ SOLN
INTRAMUSCULAR | Status: AC
  Filled 2022-09-11: qty 1

## 2022-09-11 MED ORDER — ACETAMINOPHEN 500 MG PO TABS
1000.0000 mg | ORAL_TABLET | ORAL | Status: AC
  Administered 2022-09-11: 1000 mg via ORAL
  Filled 2022-09-11: qty 2

## 2022-09-11 MED ORDER — AMISULPRIDE (ANTIEMETIC) 5 MG/2ML IV SOLN: 10.0000 mg | Freq: Once | INTRAVENOUS | Status: DC | PRN

## 2022-09-11 MED ORDER — PROPOFOL 10 MG/ML IV BOLUS
INTRAVENOUS | Status: AC
  Filled 2022-09-11: qty 20

## 2022-09-11 MED ORDER — ORAL CARE MOUTH RINSE: 15.0000 mL | Freq: Once | OROMUCOSAL | Status: AC

## 2022-09-11 MED ORDER — 0.9 % SODIUM CHLORIDE (POUR BTL) OPTIME
TOPICAL | Status: DC | PRN
  Administered 2022-09-11: 1000 mL

## 2022-09-11 MED ORDER — ONDANSETRON HCL 4 MG/2ML IJ SOLN
INTRAMUSCULAR | Status: DC | PRN
  Administered 2022-09-11: 4 mg via INTRAVENOUS

## 2022-09-11 MED ORDER — BUPIVACAINE-EPINEPHRINE (PF) 0.5% -1:200000 IJ SOLN
INTRAMUSCULAR | Status: AC
  Filled 2022-09-11: qty 30

## 2022-09-11 MED ORDER — LACTATED RINGERS IV SOLN: INTRAVENOUS | Status: DC

## 2022-09-11 MED ORDER — CIPROFLOXACIN IN D5W 400 MG/200ML IV SOLN
400.0000 mg | INTRAVENOUS | Status: AC
  Administered 2022-09-11: 400 mg via INTRAVENOUS
  Filled 2022-09-11: qty 200

## 2022-09-11 MED ORDER — DEXAMETHASONE SODIUM PHOSPHATE 10 MG/ML IJ SOLN
INTRAMUSCULAR | Status: DC | PRN
  Administered 2022-09-11: 10 mg via INTRAVENOUS

## 2022-09-11 MED ORDER — MIDAZOLAM HCL 5 MG/5ML IJ SOLN
INTRAMUSCULAR | Status: DC | PRN
  Administered 2022-09-11: 2 mg via INTRAVENOUS

## 2022-09-11 MED ORDER — LIDOCAINE 2% (20 MG/ML) 5 ML SYRINGE
INTRAMUSCULAR | Status: DC | PRN
  Administered 2022-09-11: 60 mg via INTRAVENOUS

## 2022-09-11 MED ORDER — FENTANYL CITRATE (PF) 100 MCG/2ML IJ SOLN: 25.0000 ug | INTRAMUSCULAR | Status: DC | PRN

## 2022-09-11 MED ORDER — CHLORHEXIDINE GLUCONATE CLOTH 2 % EX PADS: 6.0000 | MEDICATED_PAD | Freq: Once | CUTANEOUS | Status: DC

## 2022-09-11 MED ORDER — BUPIVACAINE-EPINEPHRINE (PF) 0.5% -1:200000 IJ SOLN
INTRAMUSCULAR | Status: DC | PRN
  Administered 2022-09-11: 20 mL

## 2022-09-11 MED ORDER — PROPOFOL 10 MG/ML IV BOLUS
INTRAVENOUS | Status: DC | PRN
  Administered 2022-09-11: 170 mg via INTRAVENOUS

## 2022-09-11 MED ORDER — PHENYLEPHRINE 80 MCG/ML (10ML) SYRINGE FOR IV PUSH (FOR BLOOD PRESSURE SUPPORT)
PREFILLED_SYRINGE | INTRAVENOUS | Status: DC | PRN
  Administered 2022-09-11: 80 ug via INTRAVENOUS

## 2022-09-11 MED ORDER — ENSURE PRE-SURGERY PO LIQD: 296.0000 mL | Freq: Once | ORAL | Status: DC

## 2022-09-11 MED ORDER — ROCURONIUM BROMIDE 10 MG/ML (PF) SYRINGE
PREFILLED_SYRINGE | INTRAVENOUS | Status: AC
  Filled 2022-09-11: qty 10

## 2022-09-11 MED ORDER — ACETAMINOPHEN 500 MG PO TABS: 1000.0000 mg | ORAL_TABLET | Freq: Once | ORAL | Status: DC

## 2022-09-11 MED ORDER — PROMETHAZINE HCL 25 MG/ML IJ SOLN: 6.2500 mg | INTRAMUSCULAR | Status: DC | PRN

## 2022-09-11 MED ORDER — CHLORHEXIDINE GLUCONATE CLOTH 2 % EX PADS
6.0000 | MEDICATED_PAD | Freq: Once | CUTANEOUS | Status: AC
  Administered 2022-09-11: 6 via TOPICAL

## 2022-09-11 MED ORDER — SUGAMMADEX SODIUM 200 MG/2ML IV SOLN
INTRAVENOUS | Status: DC | PRN
  Administered 2022-09-11: 200 mg via INTRAVENOUS

## 2022-09-11 MED ORDER — FENTANYL CITRATE (PF) 250 MCG/5ML IJ SOLN
INTRAMUSCULAR | Status: DC | PRN
  Administered 2022-09-11 (×2): 50 ug via INTRAVENOUS

## 2022-09-11 MED ORDER — ONDANSETRON HCL 4 MG/2ML IJ SOLN
INTRAMUSCULAR | Status: AC
  Filled 2022-09-11: qty 2

## 2022-09-11 MED ORDER — MIDAZOLAM HCL 2 MG/2ML IJ SOLN
INTRAMUSCULAR | Status: AC
  Filled 2022-09-11: qty 2

## 2022-09-11 MED ORDER — DEXAMETHASONE SODIUM PHOSPHATE 10 MG/ML IJ SOLN
INTRAMUSCULAR | Status: AC
  Filled 2022-09-11: qty 1

## 2022-09-11 MED ORDER — SCOPOLAMINE 1 MG/3DAYS TD PT72
1.0000 | MEDICATED_PATCH | TRANSDERMAL | Status: DC
  Administered 2022-09-11: 1.5 mg via TRANSDERMAL
  Filled 2022-09-11: qty 1

## 2022-09-11 MED ORDER — CHLORHEXIDINE GLUCONATE 0.12 % MT SOLN
15.0000 mL | Freq: Once | OROMUCOSAL | Status: AC
  Administered 2022-09-11: 15 mL via OROMUCOSAL
  Filled 2022-09-11: qty 15

## 2022-09-11 MED ORDER — OXYCODONE HCL 5 MG PO TABS: 5.0000 mg | ORAL_TABLET | Freq: Four times a day (QID) | ORAL | 0 refills | Status: AC | PRN

## 2022-09-11 MED ORDER — FENTANYL CITRATE (PF) 250 MCG/5ML IJ SOLN
INTRAMUSCULAR | Status: AC
  Filled 2022-09-11: qty 5

## 2022-09-11 MED ORDER — LIDOCAINE 2% (20 MG/ML) 5 ML SYRINGE
INTRAMUSCULAR | Status: AC
  Filled 2022-09-11: qty 5

## 2022-09-11 MED ORDER — KETOROLAC TROMETHAMINE 30 MG/ML IJ SOLN
INTRAMUSCULAR | Status: DC | PRN
  Administered 2022-09-11: 30 mg via INTRAVENOUS

## 2022-09-11 MED ORDER — ROCURONIUM BROMIDE 10 MG/ML (PF) SYRINGE
PREFILLED_SYRINGE | INTRAVENOUS | Status: DC | PRN
  Administered 2022-09-11: 70 mg via INTRAVENOUS

## 2022-09-11 MED ORDER — SODIUM CHLORIDE 0.9 % IR SOLN
Status: DC | PRN
  Administered 2022-09-11: 1000 mL

## 2022-09-11 SURGICAL SUPPLY — 37 items
ADH SKN CLS APL DERMABOND .7 (GAUZE/BANDAGES/DRESSINGS) ×1
APL PRP STRL LF DISP 70% ISPRP (MISCELLANEOUS) ×1
APPLIER CLIP 5 13 M/L LIGAMAX5 (MISCELLANEOUS) ×1
APR CLP MED LRG 5 ANG JAW (MISCELLANEOUS) ×1
BAG COUNTER SPONGE SURGICOUNT (BAG) ×2 IMPLANT
BAG SPEC RTRVL LRG 6X4 10 (ENDOMECHANICALS) ×1
BAG SPNG CNTER NS LX DISP (BAG) ×1
CANISTER SUCT 3000ML PPV (MISCELLANEOUS) ×2 IMPLANT
CHLORAPREP W/TINT 26 (MISCELLANEOUS) ×2 IMPLANT
CLIP APPLIE 5 13 M/L LIGAMAX5 (MISCELLANEOUS) ×2 IMPLANT
COVER SURGICAL LIGHT HANDLE (MISCELLANEOUS) ×2 IMPLANT
DERMABOND ADVANCED .7 DNX12 (GAUZE/BANDAGES/DRESSINGS) ×2 IMPLANT
ELECT REM PT RETURN 9FT ADLT (ELECTROSURGICAL) ×1
ELECTRODE REM PT RTRN 9FT ADLT (ELECTROSURGICAL) ×2 IMPLANT
GLOVE SURG SIGNA 7.5 PF LTX (GLOVE) ×2 IMPLANT
GOWN STRL REUS W/ TWL LRG LVL3 (GOWN DISPOSABLE) ×4 IMPLANT
GOWN STRL REUS W/ TWL XL LVL3 (GOWN DISPOSABLE) ×2 IMPLANT
GOWN STRL REUS W/TWL LRG LVL3 (GOWN DISPOSABLE) ×2
GOWN STRL REUS W/TWL XL LVL3 (GOWN DISPOSABLE) ×1
KIT BASIN OR (CUSTOM PROCEDURE TRAY) ×2 IMPLANT
KIT TURNOVER KIT B (KITS) ×2 IMPLANT
NS IRRIG 1000ML POUR BTL (IV SOLUTION) ×2 IMPLANT
PAD ARMBOARD 7.5X6 YLW CONV (MISCELLANEOUS) ×2 IMPLANT
POUCH SPECIMEN RETRIEVAL 10MM (ENDOMECHANICALS) ×2 IMPLANT
SCISSORS LAP 5X35 DISP (ENDOMECHANICALS) ×2 IMPLANT
SET IRRIG TUBING LAPAROSCOPIC (IRRIGATION / IRRIGATOR) ×2 IMPLANT
SET TUBE SMOKE EVAC HIGH FLOW (TUBING) ×2 IMPLANT
SLEEVE ENDOPATH XCEL 5M (ENDOMECHANICALS) ×4 IMPLANT
SPECIMEN JAR SMALL (MISCELLANEOUS) ×2 IMPLANT
SUT MNCRL AB 4-0 PS2 18 (SUTURE) ×2 IMPLANT
SUT VICRYL 0 UR6 27IN ABS (SUTURE) IMPLANT
TOWEL GREEN STERILE (TOWEL DISPOSABLE) ×2 IMPLANT
TOWEL GREEN STERILE FF (TOWEL DISPOSABLE) ×2 IMPLANT
TRAY LAPAROSCOPIC MC (CUSTOM PROCEDURE TRAY) ×2 IMPLANT
TROCAR XCEL BLUNT TIP 100MML (ENDOMECHANICALS) ×2 IMPLANT
TROCAR Z-THREAD OPTICAL 5X100M (TROCAR) ×2 IMPLANT
WATER STERILE IRR 1000ML POUR (IV SOLUTION) ×2 IMPLANT

## 2022-09-11 NOTE — Interval H&P Note (Signed)
History and Physical Interval Note: no change in H and P  09/11/2022 8:36 AM  Victor West  has presented today for surgery, with the diagnosis of SYMPTOMATIC CHOLELITHIASIS.  The various methods of treatment have been discussed with the patient and family. After consideration of risks, benefits and other options for treatment, the patient has consented to  Procedure(s): LAPAROSCOPIC CHOLECYSTECTOMY (N/A) as a surgical intervention.  The patient's history has been reviewed, patient examined, no change in status, stable for surgery.  I have reviewed the patient's chart and labs.  Questions were answered to the patient's satisfaction.     Coralie Keens

## 2022-09-11 NOTE — Op Note (Signed)
Laparoscopic Cholecystectomy Procedure Note  Indications: This patient presents with symptomatic gallbladder disease and will undergo laparoscopic cholecystectomy.  Pre-operative Diagnosis: symptomatic cholelithiasis  Post-operative Diagnosis: chronic cholecystitis with gallstones  Surgeon: Coralie Keens   Anesthesia: General endotracheal anesthesia  ASA Class: 1  Procedure Details  The patient was seen again in the Holding Room. The risks, benefits, complications, treatment options, and expected outcomes were discussed with the patient. The possibilities of reaction to medication, pulmonary aspiration, perforation of viscus, bleeding, recurrent infection, finding a normal gallbladder, the need for additional procedures, failure to diagnose a condition, the possible need to convert to an open procedure, and creating a complication requiring transfusion or operation were discussed with the patient. The likelihood of improving the patient's symptoms with return to their baseline status is good.  The patient and/or family concurred with the proposed plan, giving informed consent. The site of surgery properly noted. The patient was taken to Operating Room, identified as SRICHARAN LACOMB and the procedure verified as Laparoscopic Cholecystectomy with Intraoperative Cholangiogram. A Time Out was held and the above information confirmed.  Prior to the induction of general anesthesia, antibiotic prophylaxis was administered. General endotracheal anesthesia was then administered and tolerated well. After the induction, the abdomen was prepped with Chloraprep and draped in sterile fashion. The patient was positioned in the supine position.  Local anesthetic agent was injected into the skin near the umbilicus and an incision made. We dissected down to the abdominal fascia with blunt dissection.  The fascia was incised vertically and we entered the peritoneal cavity bluntly.  A pursestring suture of  0-Vicryl was placed around the fascial opening.  The Hasson cannula was inserted and secured with the stay suture.  Pneumoperitoneum was then created with CO2 and tolerated well without any adverse changes in the patient's vital signs. A 5-mm port was placed in the subxiphoid position.  Two 5-mm ports were placed in the right upper quadrant. All skin incisions were infiltrated with a local anesthetic agent before making the incision and placing the trocars.   We positioned the patient in reverse Trendelenburg, tilted slightly to the patient's left.  The gallbladder was identified, the fundus grasped and retracted cephalad. The gallbladder was chronically thick walled consistent with chronic cholecystitis. Adhesions were lysed bluntly and with the electrocautery where indicated, taking care not to injure any adjacent organs or viscus. The infundibulum was grasped and retracted laterally, exposing the peritoneum overlying the triangle of Calot. This was then divided and exposed in a blunt fashion. The cystic duct was clearly identified and bluntly dissected circumferentially. A critical view of the cystic duct and cystic artery was obtained.  The cystic duct was then ligated with clips and divided. The cystic artery was, dissected free, ligated with clips and divided as well as well as a posterior brach.  The gallbladder was dissected from the liver bed in retrograde fashion with the electrocautery. The gallbladder was removed and placed in an Endocatch sac. The liver bed was irrigated and inspected. Hemostasis was achieved with the electrocautery. Copious irrigation was utilized and was repeatedly aspirated until clear.  The gallbladder and Endocatch sac were then removed through the umbilical port site.  The pursestring suture was used to close the umbilical fascia.    We again inspected the right upper quadrant for hemostasis.  Pneumoperitoneum was released as we removed the trocars.  4-0 Monocryl was used to  close the skin.  Skin glue was then applied. The patient was then extubated and  brought to the recovery room in stable condition. Instrument, sponge, and needle counts were correct at closure and at the conclusion of the case.   Findings: Chronic cholecystitis with Cholelithiasis  Estimated Blood Loss: Minimal                Specimens: Gallbladder           Complications: None; patient tolerated the procedure well.         Disposition: PACU - hemodynamically stable.         Condition: stable

## 2022-09-11 NOTE — Anesthesia Postprocedure Evaluation (Signed)
Anesthesia Post Note  Patient: Victor West  Procedure(s) Performed: LAPAROSCOPIC CHOLECYSTECTOMY (Abdomen)     Patient location during evaluation: PACU Anesthesia Type: General Level of consciousness: sedated Pain management: pain level controlled Vital Signs Assessment: post-procedure vital signs reviewed and stable Respiratory status: spontaneous breathing and respiratory function stable Cardiovascular status: stable Postop Assessment: no apparent nausea or vomiting Anesthetic complications: no   No notable events documented.  Last Vitals:  Vitals:   09/11/22 1010 09/11/22 1025  BP: 137/79 127/78  Pulse: 61 63  Resp: 16 16  Temp:  37.1 C  SpO2: 99% 98%    Last Pain:  Vitals:   09/11/22 1025  TempSrc:   PainSc: 4                  Raegan Winders Delaine

## 2022-09-11 NOTE — Anesthesia Procedure Notes (Signed)
Procedure Name: Intubation Date/Time: 09/11/2022 9:06 AM  Performed by: Kyung Rudd, CRNAPre-anesthesia Checklist: Patient identified, Emergency Drugs available, Suction available and Patient being monitored Patient Re-evaluated:Patient Re-evaluated prior to induction Oxygen Delivery Method: Circle system utilized Preoxygenation: Pre-oxygenation with 100% oxygen Induction Type: IV induction Ventilation: Mask ventilation without difficulty Laryngoscope Size: Mac and 3 Grade View: Grade I Tube type: Oral Tube size: 7.5 mm Number of attempts: 1 Airway Equipment and Method: Stylet Placement Confirmation: ETT inserted through vocal cords under direct vision and breath sounds checked- equal and bilateral Secured at: 21 cm Tube secured with: Tape Dental Injury: Teeth and Oropharynx as per pre-operative assessment

## 2022-09-11 NOTE — Transfer of Care (Signed)
Immediate Anesthesia Transfer of Care Note  Patient: Victor West  Procedure(s) Performed: LAPAROSCOPIC CHOLECYSTECTOMY (Abdomen)  Patient Location: PACU  Anesthesia Type:General  Level of Consciousness: awake, alert , and oriented  Airway & Oxygen Therapy: Patient Spontanous Breathing  Post-op Assessment: Report given to RN, Post -op Vital signs reviewed and stable, and Patient moving all extremities X 4  Post vital signs: Reviewed and stable  Last Vitals:  Vitals Value Taken Time  BP 149/83 09/11/22 0955  Temp 36.8 C 09/11/22 0955  Pulse 72 09/11/22 0958  Resp 14 09/11/22 0958  SpO2 98 % 09/11/22 0958  Vitals shown include unvalidated device data.  Last Pain:  Vitals:   09/11/22 0955  TempSrc:   PainSc: Asleep      Patients Stated Pain Goal: 2 (09/60/45 4098)  Complications: No notable events documented.

## 2022-09-11 NOTE — Discharge Instructions (Signed)
[facility] ______[facility] LAPAROSCOPIC SURGERY: POST OP INSTRUCTIONS Always review your discharge instruction sheet given to you by the facility where your surgery was performed. IF YOU HAVE DISABILITY OR FAMILY LEAVE FORMS, YOU MUST BRING THEM TO THE OFFICE FOR PROCESSING.   DO NOT GIVE THEM TO YOUR DOCTOR.  A prescription for pain medication may be given to you upon discharge.  Take your pain medication as prescribed, if needed.  If narcotic pain medicine is not needed, then you may take acetaminophen (Tylenol) or ibuprofen (Advil) as needed. Take your usually prescribed medications unless otherwise directed. If you need a refill on your pain medication, please contact your pharmacy.  They will contact our office to request authorization. Prescriptions will not be filled after 5pm or on week-ends. You should follow a light diet the first few days after arrival home, such as soup and crackers, etc.  Be sure to include lots of fluids daily. Most patients will experience some swelling and bruising in the area of the incisions.  Ice packs will help.  Swelling and bruising can take several days to resolve.  It is common to experience some constipation if taking pain medication after surgery.  Increasing fluid intake and taking a stool softener (such as Colace) will usually help or prevent this problem from occurring.  A mild laxative (Milk of Magnesia or Miralax) should be taken according to package instructions if there are no bowel movements after 48 hours. Unless discharge instructions indicate otherwise, you may remove your bandages 24-48 hours after surgery, and you may shower at that time.  You may have steri-strips (small skin tapes) in place directly over the incision.  These strips should be left on the skin for 7-10 days.  If your surgeon used skin glue on the incision, you may shower in 24 hours.  The glue will flake off over the next 2-3 weeks.  Any sutures or staples will be  removed at the office during your follow-up visit. ACTIVITIES:  You may resume regular (light) daily activities beginning the next day--such as daily self-care, walking, climbing stairs--gradually increasing activities as tolerated.  You may have sexual intercourse when it is comfortable.  Refrain from any heavy lifting or straining until approved by your doctor. You may drive when you are no longer taking prescription pain medication, you can comfortably wear a seatbelt, and you can safely maneuver your car and apply brakes. RETURN TO WORK:  __________________________________________________________ Dennis Bast should see your doctor in the office for a follow-up appointment approximately 2-3 weeks after your surgery.  Make sure that you call for this appointment within a day or two after you arrive home to insure a convenient appointment time. OTHER INSTRUCTIONS: YOU MAY SHOWER STARTING TOMORROW ICE PACK, TYLENOL, AND IBUPROFEN ALSO FOR PAIN NO LIFTING MORE THAN 15 POUNDS FOR 2 WEEKS __________________________________________________________________________________________________________________________ __________________________________________________________________________________________________________________________ WHEN TO CALL YOUR DOCTOR: Fever over 101.0 Inability to urinate Continued bleeding from incision. Increased pain, redness, or drainage from the incision. Increasing abdominal pain  The clinic staff is available to answer your questions during regular business hours.  Please don't hesitate to call and ask to speak to one of the nurses for clinical concerns.  If you have a medical emergency, go to the nearest emergency room or call 911.  A surgeon from [facility] is always on call at the hospital. [address], [place], [place], [place]  [address] ? [address], [place], [place]   [address] [phone] ? [phone] ?  [phone] [phone] Web site:  [email or web address]

## 2022-09-12 ENCOUNTER — Encounter (HOSPITAL_COMMUNITY): Payer: Self-pay | Admitting: Surgery

## 2022-09-12 LAB — SURGICAL PATHOLOGY

## 2023-06-25 ENCOUNTER — Other Ambulatory Visit: Payer: Self-pay | Admitting: Internal Medicine

## 2023-06-25 DIAGNOSIS — R1011 Right upper quadrant pain: Secondary | ICD-10-CM

## 2023-06-29 ENCOUNTER — Other Ambulatory Visit: Payer: Self-pay | Admitting: Surgery

## 2024-03-11 ENCOUNTER — Encounter: Payer: Self-pay | Admitting: Advanced Practice Midwife
